# Patient Record
Sex: Female | Born: 1935 | Race: White | Hispanic: No | Marital: Married | Smoking: Never smoker
Health system: Southern US, Community
[De-identification: ages and names within clinical notes are randomized; demographics above are authoritative.]

## PROBLEM LIST (undated history)

## (undated) DIAGNOSIS — Z889 Allergy status to unspecified drugs, medicaments and biological substances status: Secondary | ICD-10-CM

## (undated) DIAGNOSIS — Z9049 Acquired absence of other specified parts of digestive tract: Secondary | ICD-10-CM

## (undated) DIAGNOSIS — N765 Ulceration of vagina: Secondary | ICD-10-CM

## (undated) DIAGNOSIS — N816 Rectocele: Secondary | ICD-10-CM

## (undated) DIAGNOSIS — I119 Hypertensive heart disease without heart failure: Secondary | ICD-10-CM

## (undated) DIAGNOSIS — K219 Gastro-esophageal reflux disease without esophagitis: Secondary | ICD-10-CM

## (undated) DIAGNOSIS — K2100 Gastro-esophageal reflux disease with esophagitis, without bleeding: Secondary | ICD-10-CM

## (undated) DIAGNOSIS — I499 Cardiac arrhythmia, unspecified: Secondary | ICD-10-CM

## (undated) DIAGNOSIS — R7302 Impaired glucose tolerance (oral): Secondary | ICD-10-CM

## (undated) DIAGNOSIS — Z780 Asymptomatic menopausal state: Secondary | ICD-10-CM

## (undated) DIAGNOSIS — E871 Hypo-osmolality and hyponatremia: Secondary | ICD-10-CM

## (undated) DIAGNOSIS — B019 Varicella without complication: Secondary | ICD-10-CM

## (undated) DIAGNOSIS — N952 Postmenopausal atrophic vaginitis: Secondary | ICD-10-CM

## (undated) DIAGNOSIS — M810 Age-related osteoporosis without current pathological fracture: Secondary | ICD-10-CM

## (undated) DIAGNOSIS — I10 Essential (primary) hypertension: Secondary | ICD-10-CM

## (undated) DIAGNOSIS — K5900 Constipation, unspecified: Secondary | ICD-10-CM

## (undated) DIAGNOSIS — N815 Vaginal enterocele: Secondary | ICD-10-CM

## (undated) DIAGNOSIS — R928 Other abnormal and inconclusive findings on diagnostic imaging of breast: Secondary | ICD-10-CM

## (undated) DIAGNOSIS — K649 Unspecified hemorrhoids: Secondary | ICD-10-CM

## (undated) DIAGNOSIS — B029 Zoster without complications: Secondary | ICD-10-CM

## (undated) DIAGNOSIS — H409 Unspecified glaucoma: Secondary | ICD-10-CM

## (undated) DIAGNOSIS — I447 Left bundle-branch block, unspecified: Secondary | ICD-10-CM

## (undated) DIAGNOSIS — I639 Cerebral infarction, unspecified: Secondary | ICD-10-CM

## (undated) DIAGNOSIS — L900 Lichen sclerosus et atrophicus: Secondary | ICD-10-CM

## (undated) HISTORY — DX: Acquired absence of other specified parts of digestive tract: Z90.49

## (undated) HISTORY — DX: Hypertensive heart disease without heart failure: I11.9

## (undated) HISTORY — DX: Rectocele: N81.6

## (undated) HISTORY — DX: Other abnormal and inconclusive findings on diagnostic imaging of breast: R92.8

## (undated) HISTORY — DX: Vaginal enterocele: N81.5

## (undated) HISTORY — DX: Impaired glucose tolerance (oral): R73.02

## (undated) HISTORY — DX: Gastro-esophageal reflux disease with esophagitis, without bleeding: K21.00

## (undated) HISTORY — DX: Gastro-esophageal reflux disease with esophagitis: K21.0

## (undated) HISTORY — PX: CARDIAC CATHETERIZATION: SHX172

## (undated) HISTORY — PX: ABDOMINAL HYSTERECTOMY: SHX81

## (undated) HISTORY — DX: Unspecified glaucoma: H40.9

## (undated) HISTORY — DX: Age-related osteoporosis without current pathological fracture: M81.0

## (undated) HISTORY — DX: Constipation, unspecified: K59.00

## (undated) HISTORY — DX: Asymptomatic menopausal state: Z78.0

## (undated) HISTORY — PX: CHOLECYSTECTOMY: SHX55

## (undated) HISTORY — DX: Postmenopausal atrophic vaginitis: N95.2

## (undated) HISTORY — PX: OTHER SURGICAL HISTORY: SHX169

## (undated) HISTORY — DX: Ulceration of vagina: N76.5

## (undated) HISTORY — DX: Lichen sclerosus et atrophicus: L90.0

## (undated) HISTORY — PX: TONSILLECTOMY: SUR1361

---

## 2004-11-27 ENCOUNTER — Ambulatory Visit: Payer: Self-pay | Admitting: Internal Medicine

## 2005-12-02 ENCOUNTER — Ambulatory Visit: Payer: Self-pay | Admitting: Internal Medicine

## 2006-12-08 ENCOUNTER — Ambulatory Visit: Payer: Self-pay | Admitting: Internal Medicine

## 2008-03-29 ENCOUNTER — Ambulatory Visit: Payer: Self-pay | Admitting: Internal Medicine

## 2008-09-11 ENCOUNTER — Ambulatory Visit: Payer: Self-pay

## 2009-04-09 ENCOUNTER — Ambulatory Visit: Payer: Self-pay | Admitting: Internal Medicine

## 2010-04-17 ENCOUNTER — Ambulatory Visit: Payer: Self-pay | Admitting: Internal Medicine

## 2011-11-02 ENCOUNTER — Ambulatory Visit: Payer: Self-pay | Admitting: Internal Medicine

## 2012-11-02 ENCOUNTER — Ambulatory Visit: Payer: Self-pay

## 2012-12-28 ENCOUNTER — Ambulatory Visit: Payer: Self-pay

## 2013-06-20 ENCOUNTER — Ambulatory Visit: Payer: Self-pay | Admitting: Nephrology

## 2013-07-03 ENCOUNTER — Ambulatory Visit: Payer: Self-pay | Admitting: Nephrology

## 2013-11-07 ENCOUNTER — Ambulatory Visit: Payer: Self-pay

## 2014-01-29 ENCOUNTER — Inpatient Hospital Stay: Payer: Self-pay

## 2014-01-29 LAB — COMPREHENSIVE METABOLIC PANEL
Albumin: 4 g/dL (ref 3.4–5.0)
Alkaline Phosphatase: 83 U/L
Anion Gap: 7 (ref 7–16)
BUN: 14 mg/dL (ref 7–18)
Bilirubin,Total: 0.5 mg/dL (ref 0.2–1.0)
CALCIUM: 8.5 mg/dL (ref 8.5–10.1)
CHLORIDE: 82 mmol/L — AB (ref 98–107)
Co2: 24 mmol/L (ref 21–32)
Creatinine: 1.02 mg/dL (ref 0.60–1.30)
EGFR (African American): 60 — ABNORMAL LOW
EGFR (Non-African Amer.): 52 — ABNORMAL LOW
Glucose: 106 mg/dL — ABNORMAL HIGH (ref 65–99)
Osmolality: 230 (ref 275–301)
POTASSIUM: 4.7 mmol/L (ref 3.5–5.1)
SGOT(AST): 25 U/L (ref 15–37)
SGPT (ALT): 18 U/L (ref 12–78)
Sodium: 113 mmol/L — CL (ref 136–145)
TOTAL PROTEIN: 7.3 g/dL (ref 6.4–8.2)

## 2014-01-29 LAB — URINALYSIS, COMPLETE
BACTERIA: NONE SEEN
BLOOD: NEGATIVE
Bilirubin,UR: NEGATIVE
GLUCOSE, UR: NEGATIVE mg/dL (ref 0–75)
Ketone: NEGATIVE
Leukocyte Esterase: NEGATIVE
Nitrite: NEGATIVE
PROTEIN: NEGATIVE
Ph: 6 (ref 4.5–8.0)
SPECIFIC GRAVITY: 1.011 (ref 1.003–1.030)
Squamous Epithelial: <1
WBC UR: <1 /HPF (ref 0–5)

## 2014-01-29 LAB — PROTIME-INR
INR: 1
PROTHROMBIN TIME: 13.1 s (ref 11.5–14.7)

## 2014-01-29 LAB — CBC
HCT: 37.6 % (ref 35.0–47.0)
HGB: 13.4 g/dL (ref 12.0–16.0)
MCH: 32.2 pg (ref 26.0–34.0)
MCHC: 35.7 g/dL (ref 32.0–36.0)
MCV: 90 fL (ref 80–100)
Platelet: 250 10*3/uL (ref 150–440)
RBC: 4.16 10*6/uL (ref 3.80–5.20)
RDW: 13.2 % (ref 11.5–14.5)
WBC: 8.9 10*3/uL (ref 3.6–11.0)

## 2014-01-29 LAB — SODIUM
SODIUM: 117 mmol/L — AB (ref 136–145)
Sodium: 116 mmol/L — CL (ref 136–145)

## 2014-01-29 LAB — CK TOTAL AND CKMB (NOT AT ARMC)
CK, Total: 104 U/L
CK-MB: 2.9 ng/mL (ref 0.5–3.6)

## 2014-01-29 LAB — TROPONIN I: Troponin-I: <0.02 ng/mL

## 2014-01-30 LAB — SODIUM
SODIUM: 117 mmol/L — AB (ref 136–145)
SODIUM: 119 mmol/L — AB (ref 136–145)
Sodium: 118 mmol/L — CL (ref 136–145)
Sodium: 118 mmol/L — CL (ref 136–145)
Sodium: 116 mmol/L — CL (ref 136–145)
Sodium: 121 mmol/L — ABNORMAL LOW (ref 136–145)

## 2014-01-30 LAB — TSH: Thyroid Stimulating Horm: 3.19 u[IU]/mL

## 2014-01-31 LAB — BASIC METABOLIC PANEL
Anion Gap: 8 (ref 7–16)
BUN: 13 mg/dL (ref 7–18)
CALCIUM: 8.2 mg/dL — AB (ref 8.5–10.1)
CO2: 24 mmol/L (ref 21–32)
Chloride: 92 mmol/L — ABNORMAL LOW (ref 98–107)
Creatinine: 0.91 mg/dL (ref 0.60–1.30)
EGFR (African American): >60
EGFR (Non-African Amer.): 59 — ABNORMAL LOW
GLUCOSE: 105 mg/dL — AB (ref 65–99)
OSMOLALITY: 250 (ref 275–301)
POTASSIUM: 4.5 mmol/L (ref 3.5–5.1)
Sodium: 124 mmol/L — ABNORMAL LOW (ref 136–145)

## 2014-01-31 LAB — URINE CULTURE

## 2014-02-01 LAB — SODIUM: Sodium: 124 mmol/L — ABNORMAL LOW (ref 136–145)

## 2014-02-01 LAB — BASIC METABOLIC PANEL
Anion Gap: 6 — ABNORMAL LOW (ref 7–16)
BUN: 12 mg/dL (ref 7–18)
CHLORIDE: 92 mmol/L — AB (ref 98–107)
CREATININE: 0.97 mg/dL (ref 0.60–1.30)
Calcium, Total: 8.4 mg/dL — ABNORMAL LOW (ref 8.5–10.1)
Co2: 27 mmol/L (ref 21–32)
EGFR (African American): >60
GFR CALC NON AF AMER: 55 — AB
GLUCOSE: 105 mg/dL — AB (ref 65–99)
Osmolality: 252 (ref 275–301)
POTASSIUM: 4.4 mmol/L (ref 3.5–5.1)
SODIUM: 125 mmol/L — AB (ref 136–145)

## 2014-02-02 LAB — BASIC METABOLIC PANEL
Anion Gap: 9 (ref 7–16)
BUN: 20 mg/dL — ABNORMAL HIGH (ref 7–18)
CO2: 25 mmol/L (ref 21–32)
CREATININE: 1.11 mg/dL (ref 0.60–1.30)
Calcium, Total: 8.9 mg/dL (ref 8.5–10.1)
Chloride: 92 mmol/L — ABNORMAL LOW (ref 98–107)
EGFR (African American): 54 — ABNORMAL LOW
GFR CALC NON AF AMER: 47 — AB
GLUCOSE: 108 mg/dL — AB (ref 65–99)
Osmolality: 257 (ref 275–301)
Potassium: 4.2 mmol/L (ref 3.5–5.1)
Sodium: 126 mmol/L — ABNORMAL LOW (ref 136–145)

## 2014-03-21 DIAGNOSIS — N816 Rectocele: Secondary | ICD-10-CM

## 2014-03-21 HISTORY — DX: Rectocele: N81.6

## 2014-11-08 ENCOUNTER — Ambulatory Visit: Payer: Self-pay | Admitting: Family Medicine

## 2015-03-16 NOTE — H&P (Signed)
PATIENT NAME:  Angelica Mcclain, Angelica Mcclain MR#:  [id number] DATE OF BIRTH:  [birth year]  DATE OF ADMISSION:  01/29/2014  ADMITTING PHYSICIAN: Gladstone Lighter, M.D.   PRIMARY CARE PHYSICIAN: Dr. Ola Spurr.   PRIMARY NEPHROLOGIST: Dr. Anthonette Legato.   CHIEF COMPLAINT: Syncope.   HISTORY OF PRESENT ILLNESS: Angelica Mcclain is an 81 year old, very pleasant Caucasian female with past medical history significant for hypertension, gastroesophageal reflux disease and recent incidental diagnosis of hyponatremia about 9 months ago when routine labs were done by PCP. The patient was referred to see Dr. Anthonette Legato, nephrologist, and her free water intake has been reduced recently. Her labs about 2 weeks ago done by nephrology were normal, according to the patient. She started feeling weak over the last couple of days. This morning, when she woke up, she felt very weak and went to the bathroom and passed out. She denied any seizure-like aura prior to passing out and it was an unwitnessed fall. After a few minutes, the patient woke up by herself and decided to take a shower, as she was feeling alright. A family member called at the time, and the patient was telling them that she had a fall, and so her family was alerted and she was brought to the ER. At this time, the patient is awake, alert, oriented and denies any complaints. Her sodium was noted to be at 113.   PAST MEDICAL HISTORY: 1.  Hypertension.  2.  Gastroesophageal reflux disease.  3.  Chronic left bundle branch block.  PAST SURGICAL HISTORY: 1.  Left hip surgery.  2.  Hysterectomy.  3.  Bladder tack surgery.  4.  Tonsillectomy. 5.  Cholecystectomy.   ALLERGIES TO MEDICATIONS: PENICILLIN.   CURRENT HOME MEDICATIONS: 1.  Premarin vaginal cream 4.431 mg 1 applicator every other day.  2.  Benazepril 40 mg p.o. daily.  3.  Aldactone 25 mg p.o. daily.  4.  Clobetasol 0.05% ointment as needed for itching, rash.  5.  Verapamil 300 mg p.o. at bedtime.   6.  Hydroxyzine 25 mg 1 to 2 tablets at bedtime.  7.  Omeprazole 20 mg p.o. daily.   SOCIAL HISTORY: Lives at home with her husband. No smoking or alcohol use.   FAMILY HISTORY: Mom with heart disease. Significant history of cancer in the family. Two sisters died from cancer; one of them had lung cancer and the other with renal cell carcinoma and 1 other brother also had lung cancer.   REVIEW OF SYSTEMS:  CONSTITUTIONAL: No fever. Positive for fatigue and weakness.  EYES: No blurred vision, double vision, inflammation, glaucoma or cataracts. Uses reading glasses.  EARS, NOSE, THROAT: No tinnitus, ear pain, hearing loss, epistaxis or discharge.   RESPIRATORY: No cough, wheeze, hemoptysis or COPD.  CARDIOVASCULAR: No chest pain, orthopnea, edema, arrhythmia, palpitations or syncope.  GASTROINTESTINAL: No nausea, vomiting, diarrhea, abdominal pain, hematemesis or melena.  GENITOURINARY: No dysuria. Decreased frequency of urination noted. No renal calculus or incontinence.  ENDOCRINE: No polyuria, nocturia, thyroid problems, heat or cold intolerance.  HEMATOLOGY: No anemia or easy bruising. Mild bleeding from the skin tears that she developed  after the fall.   MUSCULOSKELETAL: No neck, back or shoulder pain, arthritis or gout.  SKIN: No acne, rash or lesions.  NEUROLOGIC: No numbness, weakness, CVA, TIA or seizures.  PSYCHOLOGIC: No anxiety, insomnia or depression.   PHYSICAL EXAMINATION: VITAL SIGNS: Temperature 97.9 degrees Fahrenheit, pulse 74, respirations 18, blood pressure 168/73, pulse ox 97% on room air.  GENERAL: Well-built, well-nourished  female lying in bed, not in any acute distress.  HEENT: Normocephalic, atraumatic. Pupils equal, round, reacting to light. Anicteric sclerae. Extraocular movements intact. Oropharynx clear without erythema, mass or exudates.  NECK: Supple. No thyromegaly, JVD or carotid bruits. No lymphadenopathy.  LUNGS: Clear to auscultation bilaterally. No  wheeze or crackles. No use of accessory muscles for breathing.  CARDIOVASCULAR: S1, S2, regular rate and rhythm. A 3/6 systolic murmur present. No rubs or gallops.  ABDOMEN: Soft, nontender, nondistended. No hepatosplenomegaly. Normal bowel sounds.  EXTREMITIES: No pedal edema. No clubbing or cyanosis, 2+ dorsalis pedis pulses palpable bilaterally.  SKIN: No acne, rash or lesions other than small abrasions noted on the right side of the forehead and also the right leg from the new fall. Bleeding has stopped currently.  NEUROLOGICAL:  Cranial nerves II through XII remain intact. No gross motor or sensory deficits.  PSYCHOLOGICAL: The patient is awake, alert, oriented x 3.   LABORATORY DATA: WBC is 8.9, hemoglobin 13.4, hematocrit 37.3, platelet count 250.   Sodium 113, potassium 4.7, chloride 82, bicarb 24, BUN 14, creatinine 1.02, glucose 106 and calcium of 8.5.   ALT 18, AST 25, alk phos 83, total bili 0.5 and albumin of 4.0. INR is 1.0. CK 104, CK-MB 2.9, troponin less than 0.02.  Chest x-ray showing clear lung fields, minimal right basilar atelectasis is present. CT of the head without contrast showing no evidence of acute intracranial hemorrhage. Old lacunar infarctions in the left basal ganglia are present, which are stable, and there is evidence of chronic small vessel ischemic disease. No evidence of any acute skull fracture noted.   EKG showing left bundle branch block which is chronic, according to the patient. Heart rate of 71.   ASSESSMENT AND PLAN: An 81 year old female with history of hypertension and left bundle branch block presenting with hyponatremia with sodium of 113 and syncopal episode.  1.  Acute on chronic hyponatremia, likely cause of weakness and fall. Could be hypovolemia on top of syndrome of inappropriate antidiuretic hormone secretion is likely. Continue IV normal saline at this time. Recheck sodium this evening, and we will get consult.  2.  Hypertension. Continue  Benicar, verapamil and Aldactone.  3.  Gastroesophageal reflux disease.  The patient is on Protonix.   CODE STATUS: FULL CODE.   TIME SPENT ON ADMISSION: 50 minutes.   EYES: [place] lipids 1000 office chronic left bundle branch block and has    ____________________________ Gladstone Lighter, MD rk:dmm D: 01/29/2014 12:16:18 ET T: 01/29/2014 13:22:44 ET JOB#: [id number]  cc: Gladstone Lighter, MD, <Dictator> [place] Lilian Kapur, MD Cheral Marker. Ola Spurr, MD Gladstone Lighter MD ELECTRONICALLY SIGNED 01/29/2014 15:12

## 2015-03-16 NOTE — Discharge Summary (Signed)
PATIENT NAME:  Angelica Mcclain, Angelica Mcclain MR#:  [id number] DATE OF BIRTH:  05/17/1930  DATE OF ADMISSION:  01/29/2014 DATE OF DISCHARGE:  02/02/2014  DISCHARGE DIAGNOSES: 1.  Hyponatremia.  2.  Hypertension.  3.  Cough.   CONSULTING PHYSICIAN:  Dr. Candiss Norse in nephrology.   HISTORY OF PRESENT ILLNESS: Please see initial history and physical. Briefly, the patient was admitted after a fall. She was found to have a sodium of 113.   HOSPITAL COURSE BY ISSUE:  1.  Hyponatremia. The patient was seen by renal. Her sodium was managed with IV fluids and normal saline, as well as fluid restriction. On day of discharge, sodium was up to 126. We did advise her to limit her free water intake.  2.  Cough. The patient had a chest x-ray that did show some mild abnormalities, but CT of her chest was negative for any malignancy or pneumonia.  3.  Hypertension. The patient remained on her olmesartan and verapamil. We did hold spironolactone. Blood pressure was a little elevated in that hospital but, she was discharged on those medications.  4.  Gastroesophageal reflux disease. She continued on her PPI.  5.  Urinary retention. The patient had an issue at first where she had difficulty urinating and had 1000 mL. She had a Foley for 1 day. Urinalysis was negative. She was able to void for several days after the catheter was removed.   DISCHARGE MEDICATIONS: Please see [facility] discharge summary. The patient will stop her spironolactone.  DISCHARGE DIET: Regular diet with regular consistency. We advised her to free fluid restrict but not to restrict her sodium at this point.   DISCHARGE FOLLOWUP:  The patient will follow up with Dr. Ola Mcclain in 1 to 2 weeks. She will need a B-met at that day. She will also follow up with renal.   This discharge took 40 minutes.   ____________________________ Angelica Mcclain. Angelica Spurr, MD dpf:dmm D: 02/05/2014 10:40:52 ET T: 02/05/2014 19:41:38 ET JOB#: [id number]  cc: Angelica Mcclain. Angelica Spurr, MD,  <Dictator> Angelica Angelica Spurr MD ELECTRONICALLY SIGNED 02/05/2014 21:08

## 2015-04-09 ENCOUNTER — Other Ambulatory Visit: Payer: Self-pay | Admitting: Nurse Practitioner

## 2015-04-09 DIAGNOSIS — K219 Gastro-esophageal reflux disease without esophagitis: Secondary | ICD-10-CM

## 2015-04-16 ENCOUNTER — Ambulatory Visit: Payer: Self-pay

## 2015-04-23 ENCOUNTER — Ambulatory Visit: Payer: Self-pay

## 2015-04-30 ENCOUNTER — Encounter: Payer: Self-pay | Admitting: *Deleted

## 2015-05-02 ENCOUNTER — Emergency Department: Payer: PPO

## 2015-05-02 ENCOUNTER — Ambulatory Visit: Admission: RE | Admit: 2015-05-02 | Payer: PPO | Source: Ambulatory Visit | Admitting: Unknown Physician Specialty

## 2015-05-02 ENCOUNTER — Encounter
Admission: EM | Disposition: A | Discharge status: Other Acute Inpatient Hospital | Payer: Self-pay | Source: Home / Self Care | Attending: Emergency Medicine

## 2015-05-02 ENCOUNTER — Inpatient Hospital Stay (HOSPITAL_COMMUNITY): Payer: PPO

## 2015-05-02 ENCOUNTER — Inpatient Hospital Stay (HOSPITAL_COMMUNITY)
Admission: EM | Admit: 2015-05-02 | Discharge: 2015-05-24 | DRG: 311 | Disposition: E | Payer: PPO | Source: Other Acute Inpatient Hospital | Attending: Pulmonary Disease | Admitting: Pulmonary Disease

## 2015-05-02 ENCOUNTER — Emergency Department
Admission: EM | Admit: 2015-05-02 | Discharge: 2015-05-02 | Disposition: A | Discharge status: Other Acute Inpatient Hospital | Payer: PPO | Attending: Emergency Medicine | Admitting: Emergency Medicine

## 2015-05-02 ENCOUNTER — Encounter: Payer: Self-pay | Admitting: Emergency Medicine

## 2015-05-02 DIAGNOSIS — Z885 Allergy status to narcotic agent status: Secondary | ICD-10-CM

## 2015-05-02 DIAGNOSIS — Z7982 Long term (current) use of aspirin: Secondary | ICD-10-CM | POA: Insufficient documentation

## 2015-05-02 DIAGNOSIS — N189 Chronic kidney disease, unspecified: Secondary | ICD-10-CM | POA: Diagnosis present

## 2015-05-02 DIAGNOSIS — I249 Acute ischemic heart disease, unspecified: Secondary | ICD-10-CM | POA: Diagnosis present

## 2015-05-02 DIAGNOSIS — I440 Atrioventricular block, first degree: Secondary | ICD-10-CM | POA: Diagnosis not present

## 2015-05-02 DIAGNOSIS — I469 Cardiac arrest, cause unspecified: Secondary | ICD-10-CM | POA: Diagnosis not present

## 2015-05-02 DIAGNOSIS — R40243 Glasgow coma scale score 3-8: Secondary | ICD-10-CM | POA: Diagnosis not present

## 2015-05-02 DIAGNOSIS — M810 Age-related osteoporosis without current pathological fracture: Secondary | ICD-10-CM | POA: Insufficient documentation

## 2015-05-02 DIAGNOSIS — J9601 Acute respiratory failure with hypoxia: Secondary | ICD-10-CM | POA: Diagnosis present

## 2015-05-02 DIAGNOSIS — I462 Cardiac arrest due to underlying cardiac condition: Secondary | ICD-10-CM | POA: Diagnosis present

## 2015-05-02 DIAGNOSIS — J9600 Acute respiratory failure, unspecified whether with hypoxia or hypercapnia: Secondary | ICD-10-CM

## 2015-05-02 DIAGNOSIS — Z8673 Personal history of transient ischemic attack (TIA), and cerebral infarction without residual deficits: Secondary | ICD-10-CM | POA: Diagnosis not present

## 2015-05-02 DIAGNOSIS — Z66 Do not resuscitate: Secondary | ICD-10-CM | POA: Diagnosis present

## 2015-05-02 DIAGNOSIS — N179 Acute kidney failure, unspecified: Secondary | ICD-10-CM | POA: Diagnosis present

## 2015-05-02 DIAGNOSIS — I447 Left bundle-branch block, unspecified: Secondary | ICD-10-CM | POA: Diagnosis present

## 2015-05-02 DIAGNOSIS — I2510 Atherosclerotic heart disease of native coronary artery without angina pectoris: Secondary | ICD-10-CM

## 2015-05-02 DIAGNOSIS — E785 Hyperlipidemia, unspecified: Secondary | ICD-10-CM | POA: Diagnosis present

## 2015-05-02 DIAGNOSIS — I2109 ST elevation (STEMI) myocardial infarction involving other coronary artery of anterior wall: Secondary | ICD-10-CM | POA: Diagnosis not present

## 2015-05-02 DIAGNOSIS — E872 Acidosis, unspecified: Secondary | ICD-10-CM

## 2015-05-02 DIAGNOSIS — I4901 Ventricular fibrillation: Secondary | ICD-10-CM

## 2015-05-02 DIAGNOSIS — R579 Shock, unspecified: Secondary | ICD-10-CM | POA: Diagnosis not present

## 2015-05-02 DIAGNOSIS — R4020 Unspecified coma: Secondary | ICD-10-CM

## 2015-05-02 DIAGNOSIS — Q219 Congenital malformation of cardiac septum, unspecified: Secondary | ICD-10-CM | POA: Diagnosis not present

## 2015-05-02 DIAGNOSIS — I1310 Hypertensive heart and chronic kidney disease without heart failure, with stage 1 through stage 4 chronic kidney disease, or unspecified chronic kidney disease: Secondary | ICD-10-CM | POA: Diagnosis present

## 2015-05-02 DIAGNOSIS — I6782 Cerebral ischemia: Secondary | ICD-10-CM | POA: Insufficient documentation

## 2015-05-02 DIAGNOSIS — Z90710 Acquired absence of both cervix and uterus: Secondary | ICD-10-CM

## 2015-05-02 DIAGNOSIS — H409 Unspecified glaucoma: Secondary | ICD-10-CM | POA: Insufficient documentation

## 2015-05-02 DIAGNOSIS — Z880 Allergy status to penicillin: Secondary | ICD-10-CM | POA: Diagnosis not present

## 2015-05-02 DIAGNOSIS — Z515 Encounter for palliative care: Secondary | ICD-10-CM | POA: Diagnosis not present

## 2015-05-02 DIAGNOSIS — K219 Gastro-esophageal reflux disease without esophagitis: Secondary | ICD-10-CM | POA: Insufficient documentation

## 2015-05-02 DIAGNOSIS — Z955 Presence of coronary angioplasty implant and graft: Secondary | ICD-10-CM | POA: Diagnosis not present

## 2015-05-02 DIAGNOSIS — E876 Hypokalemia: Secondary | ICD-10-CM | POA: Diagnosis present

## 2015-05-02 DIAGNOSIS — Z79899 Other long term (current) drug therapy: Secondary | ICD-10-CM | POA: Insufficient documentation

## 2015-05-02 DIAGNOSIS — R570 Cardiogenic shock: Secondary | ICD-10-CM

## 2015-05-02 HISTORY — DX: Varicella without complication: B01.9

## 2015-05-02 HISTORY — DX: Cardiac arrhythmia, unspecified: I49.9

## 2015-05-02 HISTORY — DX: Allergy status to unspecified drugs, medicaments and biological substances: Z88.9

## 2015-05-02 HISTORY — DX: Cerebral infarction, unspecified: I63.9

## 2015-05-02 HISTORY — DX: Hypo-osmolality and hyponatremia: E87.1

## 2015-05-02 HISTORY — PX: CARDIAC CATHETERIZATION: SHX172

## 2015-05-02 HISTORY — DX: Unspecified hemorrhoids: K64.9

## 2015-05-02 HISTORY — DX: Gastro-esophageal reflux disease without esophagitis: K21.9

## 2015-05-02 HISTORY — DX: Left bundle-branch block, unspecified: I44.7

## 2015-05-02 HISTORY — DX: Essential (primary) hypertension: I10

## 2015-05-02 HISTORY — DX: Zoster without complications: B02.9

## 2015-05-02 LAB — BLOOD GAS, ARTERIAL
Acid-base deficit: 21.9 mmol/L — ABNORMAL HIGH (ref 0.0–2.0)
Acid-base deficit: 12 mmol/L — ABNORMAL HIGH (ref 0.0–2.0)
Allens test (pass/fail): POSITIVE — AB
Allens test (pass/fail): POSITIVE — AB
BICARBONATE: 8.3 meq/L — AB (ref 21.0–28.0)
BICARBONATE: 14 meq/L — AB (ref 21.0–28.0)
FIO2: 1 %
FIO2: 0.5 %
LHR: 24 {breaths}/min
MECHVT: 500 mL
O2 SAT: 96.8 %
O2 Saturation: 99.9 %
PATIENT TEMPERATURE: 37
PCO2 ART: 33 mmHg (ref 32.0–48.0)
PEEP/CPAP: 5 cmH2O
PEEP: 5 cmH2O
PH ART: 7.01 — AB (ref 7.350–7.450)
Patient temperature: 37
RATE: 24 resp/min
VT: 500 mL
pCO2 arterial: 32 mmHg (ref 32.0–48.0)
pH, Arterial: 7.25 — ABNORMAL LOW (ref 7.350–7.450)
pO2, Arterial: 335 mmHg — ABNORMAL HIGH (ref 83.0–108.0)
pO2, Arterial: 102 mmHg (ref 83.0–108.0)

## 2015-05-02 LAB — COMPREHENSIVE METABOLIC PANEL
ALK PHOS: 50 U/L (ref 38–126)
ALT: 74 U/L — ABNORMAL HIGH (ref 14–54)
ALT: 54 U/L (ref 14–54)
ANION GAP: 18 — AB (ref 5–15)
AST: 77 U/L — ABNORMAL HIGH (ref 15–41)
AST: 84 U/L — ABNORMAL HIGH (ref 15–41)
Albumin: 3 g/dL — ABNORMAL LOW (ref 3.5–5.0)
Albumin: 1.8 g/dL — ABNORMAL LOW (ref 3.5–5.0)
Alkaline Phosphatase: 72 U/L (ref 38–126)
Anion gap: 13 (ref 5–15)
BUN: 19 mg/dL (ref 6–20)
BUN: 22 mg/dL — AB (ref 6–20)
CO2: 13 mmol/L — AB (ref 22–32)
CO2: 12 mmol/L — AB (ref 22–32)
CREATININE: 1.55 mg/dL — AB (ref 0.44–1.00)
Calcium: 8 mg/dL — ABNORMAL LOW (ref 8.9–10.3)
Calcium: 6.3 mg/dL — CL (ref 8.9–10.3)
Chloride: 108 mmol/L (ref 101–111)
Chloride: 111 mmol/L (ref 101–111)
Creatinine, Ser: 1.85 mg/dL — ABNORMAL HIGH (ref 0.44–1.00)
GFR calc Af Amer: 35 mL/min — ABNORMAL LOW (ref >60)
GFR calc non Af Amer: 30 mL/min — ABNORMAL LOW (ref >60)
GFR calc non Af Amer: 24 mL/min — ABNORMAL LOW (ref >60)
GFR, EST AFRICAN AMERICAN: 28 mL/min — AB (ref >60)
GLUCOSE: 452 mg/dL — AB (ref 65–99)
Glucose, Bld: 442 mg/dL — ABNORMAL HIGH (ref 65–99)
POTASSIUM: 3.3 mmol/L — AB (ref 3.5–5.1)
Potassium: 3.1 mmol/L — ABNORMAL LOW (ref 3.5–5.1)
Sodium: 139 mmol/L (ref 135–145)
Sodium: 136 mmol/L (ref 135–145)
TOTAL PROTEIN: 3.2 g/dL — AB (ref 6.5–8.1)
Total Bilirubin: 0.4 mg/dL (ref 0.3–1.2)
Total Bilirubin: 0.6 mg/dL (ref 0.3–1.2)
Total Protein: 5.6 g/dL — ABNORMAL LOW (ref 6.5–8.1)

## 2015-05-02 LAB — CBC WITH DIFFERENTIAL/PLATELET
Basophils Absolute: 0.1 10*3/uL (ref 0–0.1)
Basophils Relative: 1 %
Eosinophils Absolute: 0.3 10*3/uL (ref 0–0.7)
Eosinophils Relative: 2 %
HEMATOCRIT: 37.9 % (ref 35.0–47.0)
HEMOGLOBIN: 11.9 g/dL — AB (ref 12.0–16.0)
LYMPHS ABS: 5.7 10*3/uL — AB (ref 1.0–3.6)
Lymphocytes Relative: 35 %
MCH: 30.8 pg (ref 26.0–34.0)
MCHC: 31.3 g/dL — ABNORMAL LOW (ref 32.0–36.0)
MCV: 98.5 fL (ref 80.0–100.0)
Monocytes Absolute: 0.6 10*3/uL (ref 0.2–0.9)
Monocytes Relative: 4 %
NEUTROS ABS: 9.6 10*3/uL — AB (ref 1.4–6.5)
NEUTROS PCT: 58 %
Platelets: 117 10*3/uL — ABNORMAL LOW (ref 150–440)
RBC: 3.85 MIL/uL (ref 3.80–5.20)
RDW: 14.3 % (ref 11.5–14.5)
WBC: 16.3 10*3/uL — AB (ref 3.6–11.0)

## 2015-05-02 LAB — URINALYSIS COMPLETE WITH MICROSCOPIC (ARMC ONLY)
BILIRUBIN URINE: NEGATIVE
Bacteria, UA: NONE SEEN
Glucose, UA: NEGATIVE mg/dL
Hgb urine dipstick: NEGATIVE
KETONES UR: NEGATIVE mg/dL
LEUKOCYTES UA: NEGATIVE
Nitrite: NEGATIVE
PH: 5 (ref 5.0–8.0)
PROTEIN: NEGATIVE mg/dL
Specific Gravity, Urine: 1.016 (ref 1.005–1.030)
Squamous Epithelial / LPF: NONE SEEN

## 2015-05-02 LAB — URINALYSIS, ROUTINE W REFLEX MICROSCOPIC
BILIRUBIN URINE: NEGATIVE
Glucose, UA: 250 mg/dL — AB
Ketones, ur: NEGATIVE mg/dL
Leukocytes, UA: NEGATIVE
Nitrite: NEGATIVE
PH: 6 (ref 5.0–8.0)
Protein, ur: 100 mg/dL — AB
Specific Gravity, Urine: 1.01 (ref 1.005–1.030)
Urobilinogen, UA: 0.2 mg/dL (ref 0.0–1.0)

## 2015-05-02 LAB — URINE MICROSCOPIC-ADD ON

## 2015-05-02 LAB — CBC
HCT: 22.9 % — ABNORMAL LOW (ref 36.0–46.0)
Hemoglobin: 7.5 g/dL — ABNORMAL LOW (ref 12.0–15.0)
MCH: 31 pg (ref 26.0–34.0)
MCHC: 32.8 g/dL (ref 30.0–36.0)
MCV: 94.6 fL (ref 78.0–100.0)
Platelets: 174 10*3/uL (ref 150–400)
RBC: 2.42 MIL/uL — ABNORMAL LOW (ref 3.87–5.11)
RDW: 13.8 % (ref 11.5–15.5)
WBC: 20.5 10*3/uL — ABNORMAL HIGH (ref 4.0–10.5)

## 2015-05-02 LAB — MRSA PCR SCREENING: MRSA BY PCR: NEGATIVE

## 2015-05-02 LAB — PROTIME-INR
INR: 1.69
Prothrombin Time: 20.1 seconds — ABNORMAL HIGH (ref 11.4–15.0)

## 2015-05-02 LAB — TROPONIN I
Troponin I: 0.22 ng/mL — ABNORMAL HIGH (ref <0.031)
Troponin I: 2.63 ng/mL (ref <0.031)

## 2015-05-02 LAB — CORTISOL: CORTISOL PLASMA: 24.8 ug/dL

## 2015-05-02 LAB — LACTIC ACID, PLASMA: LACTIC ACID, VENOUS: 9.6 mmol/L — AB (ref 0.5–2.0)

## 2015-05-02 LAB — PHOSPHORUS: Phosphorus: 3.9 mg/dL (ref 2.5–4.6)

## 2015-05-02 LAB — MAGNESIUM: MAGNESIUM: 1.7 mg/dL (ref 1.7–2.4)

## 2015-05-02 SURGERY — EGD (ESOPHAGOGASTRODUODENOSCOPY)
Anesthesia: General

## 2015-05-02 SURGERY — LEFT HEART CATH AND CORONARY ANGIOGRAPHY
Anesthesia: Moderate Sedation

## 2015-05-02 MED ORDER — MIDAZOLAM HCL 2 MG/2ML IJ SOLN
INTRAMUSCULAR | Status: AC
  Filled 2015-05-02: qty 4

## 2015-05-02 MED ORDER — TIROFIBAN HCL IV 5 MG/100ML: INTRAVENOUS | Status: DC | PRN

## 2015-05-02 MED ORDER — TICAGRELOR 90 MG PO TABS
ORAL_TABLET | ORAL | Status: DC | PRN
  Administered 2015-05-02: 180 mg via ORAL

## 2015-05-02 MED ORDER — TICAGRELOR 90 MG PO TABS: 90.0000 mg | ORAL_TABLET | Freq: Two times a day (BID) | ORAL | Status: DC

## 2015-05-02 MED ORDER — PANTOPRAZOLE SODIUM 40 MG IV SOLR: 40.0000 mg | Freq: Every day | INTRAVENOUS | Status: DC

## 2015-05-02 MED ORDER — NOREPINEPHRINE 4 MG/250ML-% IV SOLN
INTRAVENOUS | Status: AC
  Administered 2015-05-02: 4 mg
  Filled 2015-05-02: qty 250

## 2015-05-02 MED ORDER — BIVALIRUDIN 250 MG IV SOLR
INTRAVENOUS | Status: AC
  Filled 2015-05-02: qty 250

## 2015-05-02 MED ORDER — SODIUM CHLORIDE 0.9 % IV SOLN: 250.0000 mL | INTRAVENOUS | Status: DC | PRN

## 2015-05-02 MED ORDER — FLUMAZENIL 0.5 MG/5ML IV SOLN
INTRAVENOUS | Status: AC
  Filled 2015-05-02: qty 5

## 2015-05-02 MED ORDER — MIDAZOLAM HCL 2 MG/2ML IJ SOLN
INTRAMUSCULAR | Status: DC | PRN
  Administered 2015-05-02: 4 mg via INTRAVENOUS

## 2015-05-02 MED ORDER — AMIODARONE IV BOLUS ONLY 150 MG/100ML
INTRAVENOUS | Status: AC
  Filled 2015-05-02: qty 100

## 2015-05-02 MED ORDER — SODIUM CHLORIDE 0.9 % IV SOLN
1.0000 mg/h | INTRAVENOUS | Status: DC
  Filled 2015-05-02: qty 10

## 2015-05-02 MED ORDER — FENTANYL CITRATE (PF) 100 MCG/2ML IJ SOLN
INTRAMUSCULAR | Status: DC | PRN
  Administered 2015-05-02: 50 ug via INTRAVENOUS

## 2015-05-02 MED ORDER — DEXTROSE 5 % IV SOLN: 60.0000 mg/h | Freq: Once | INTRAVENOUS | Status: DC

## 2015-05-02 MED ORDER — MIDAZOLAM HCL 2 MG/2ML IJ SOLN: 1.0000 mg | INTRAMUSCULAR | Status: DC | PRN

## 2015-05-02 MED ORDER — AMIODARONE HCL IN DEXTROSE 360-4.14 MG/200ML-% IV SOLN
60.0000 mg/h | INTRAVENOUS | Status: DC
  Administered 2015-05-02: 60 mg/h via INTRAVENOUS

## 2015-05-02 MED ORDER — SODIUM CHLORIDE 0.9 % IV SOLN
INTRAVENOUS | Status: AC
  Filled 2015-05-02: qty 2000

## 2015-05-02 MED ORDER — NITROGLYCERIN 5 MG/ML IV SOLN
INTRAVENOUS | Status: AC
  Filled 2015-05-02: qty 10

## 2015-05-02 MED ORDER — SODIUM CHLORIDE 0.9 % IV SOLN
INTRAVENOUS | Status: DC
  Administered 2015-05-02: 14:00:00 via INTRAVENOUS

## 2015-05-02 MED ORDER — SODIUM CHLORIDE 0.9 % IV SOLN: 1000.0000 mL | Freq: Once | INTRAVENOUS | Status: DC

## 2015-05-02 MED ORDER — MIDAZOLAM HCL 5 MG/ML IJ SOLN
1.0000 mg/h | INTRAMUSCULAR | Status: DC
  Filled 2015-05-02: qty 10

## 2015-05-02 MED ORDER — TICAGRELOR 90 MG PO TABS
ORAL_TABLET | ORAL | Status: AC
  Filled 2015-05-02: qty 2

## 2015-05-02 MED ORDER — FENTANYL CITRATE (PF) 100 MCG/2ML IJ SOLN: 100.0000 ug | INTRAMUSCULAR | Status: DC | PRN

## 2015-05-02 MED ORDER — FENTANYL CITRATE (PF) 100 MCG/2ML IJ SOLN: 50.0000 ug | INTRAMUSCULAR | Status: DC | PRN

## 2015-05-02 MED ORDER — NOREPINEPHRINE BITARTRATE 1 MG/ML IV SOLN
INTRAVENOUS | Status: DC | PRN
  Administered 2015-05-02: 10 ug via INTRAVENOUS

## 2015-05-02 MED ORDER — IOHEXOL 300 MG/ML  SOLN
INTRAMUSCULAR | Status: DC | PRN
Start: 2015-05-02 — End: 2015-05-02
  Administered 2015-05-02: 25 mL via INTRAVENOUS
  Administered 2015-05-02: 130 mL via INTRAVENOUS
  Administered 2015-05-02: 30 mL via INTRAVENOUS

## 2015-05-02 MED ORDER — HEPARIN SODIUM (PORCINE) 5000 UNIT/ML IJ SOLN: 5000.0000 [IU] | Freq: Three times a day (TID) | INTRAMUSCULAR | Status: DC

## 2015-05-02 MED ORDER — BIVALIRUDIN BOLUS VIA INFUSION - CUPID
INTRAVENOUS | Status: DC | PRN
  Administered 2015-05-02: 61.2 mg via INTRAVENOUS

## 2015-05-02 MED ORDER — FENTANYL CITRATE (PF) 100 MCG/2ML IJ SOLN
INTRAMUSCULAR | Status: AC
  Filled 2015-05-02: qty 2

## 2015-05-02 MED ORDER — SODIUM BICARBONATE 8.4 % IV SOLN: 50.0000 meq | Freq: Once | INTRAVENOUS | Status: DC

## 2015-05-02 MED ORDER — ASPIRIN 81 MG PO CHEW: 81.0000 mg | CHEWABLE_TABLET | Freq: Every day | ORAL | Status: DC

## 2015-05-02 MED ORDER — MIDAZOLAM HCL 2 MG/2ML IJ SOLN: 4.0000 mg | INTRAMUSCULAR | Status: DC | PRN

## 2015-05-02 MED ORDER — NOREPINEPHRINE BITARTRATE 1 MG/ML IV SOLN
2.0000 ug/min | INTRAVENOUS | Status: DC
  Filled 2015-05-02: qty 4

## 2015-05-02 MED ORDER — FLUMAZENIL 1 MG/10ML IV SOLN
INTRAVENOUS | Status: DC | PRN
  Administered 2015-05-02: 0.2 mg via INTRAVENOUS

## 2015-05-02 MED ORDER — LORAZEPAM 2 MG/ML IJ SOLN: 1.0000 mg | Freq: Once | INTRAMUSCULAR | Status: DC

## 2015-05-02 MED ORDER — SODIUM CHLORIDE 0.9 % IJ SOLN: 3.0000 mL | Freq: Two times a day (BID) | INTRAMUSCULAR | Status: DC

## 2015-05-02 MED ORDER — AMIODARONE HCL IN DEXTROSE 360-4.14 MG/200ML-% IV SOLN: 30.0000 mg/h | INTRAVENOUS | Status: DC

## 2015-05-02 MED ORDER — CHLORHEXIDINE GLUCONATE 0.12 % MT SOLN: 15.0000 mL | Freq: Two times a day (BID) | OROMUCOSAL | Status: DC

## 2015-05-02 MED ORDER — IOPAMIDOL (ISOVUE-370) INJECTION 76%: INTRAVENOUS | Status: DC | PRN

## 2015-05-02 MED ORDER — SODIUM CHLORIDE 0.9 % IJ SOLN: 3.0000 mL | INTRAMUSCULAR | Status: DC | PRN

## 2015-05-02 MED ORDER — AMIODARONE HCL IN DEXTROSE 360-4.14 MG/200ML-% IV SOLN
INTRAVENOUS | Status: AC
Start: 2015-05-02 — End: 2015-05-02
  Administered 2015-05-02: 60 mg/h via INTRAVENOUS
  Filled 2015-05-02: qty 200

## 2015-05-02 MED ORDER — NOREPINEPHRINE BITARTRATE 1 MG/ML IV SOLN: 8.0000 ug/min | Freq: Once | INTRAVENOUS | Status: DC

## 2015-05-02 MED ORDER — CETYLPYRIDINIUM CHLORIDE 0.05 % MT LIQD: 7.0000 mL | Freq: Four times a day (QID) | OROMUCOSAL | Status: DC

## 2015-05-02 MED ORDER — SODIUM CHLORIDE 0.9 % IV SOLN: 250.0000 mg | INTRAVENOUS | Status: DC | PRN

## 2015-05-02 MED ORDER — HEPARIN (PORCINE) IN NACL 2-0.9 UNIT/ML-% IJ SOLN
INTRAMUSCULAR | Status: AC
  Filled 2015-05-02: qty 1000

## 2015-05-02 MED ORDER — VASOPRESSIN 20 UNIT/ML IV SOLN
0.0300 [IU]/min | INTRAVENOUS | Status: DC
  Filled 2015-05-02: qty 2

## 2015-05-02 MED ORDER — NOREPINEPHRINE 4 MG/250ML-% IV SOLN
0.0000 ug/min | INTRAVENOUS | Status: DC
  Filled 2015-05-02: qty 250

## 2015-05-02 MED ORDER — LORAZEPAM 2 MG/ML IJ SOLN
INTRAMUSCULAR | Status: AC
  Filled 2015-05-02: qty 1

## 2015-05-02 SURGICAL SUPPLY — 17 items
BALLN TREK RX 2.5X20 (BALLOONS) ×3
BALLN ~~LOC~~ TREK RX 3.0X20 (BALLOONS) ×3
BALLOON TREK RX 2.5X20 (BALLOONS) ×1 IMPLANT
BALLOON ~~LOC~~ TREK RX 3.0X20 (BALLOONS) ×1 IMPLANT
CATH INFINITI 5FR ANG PIGTAIL (CATHETERS) ×3 IMPLANT
CATH INFINITI 5FR JL4 (CATHETERS) ×3 IMPLANT
CATH INFINITI JR4 5F (CATHETERS) ×3 IMPLANT
CATH VISTA GUIDE 6FR XBLAD4 (CATHETERS) ×3 IMPLANT
DEVICE CLOSURE MYNXGRIP 6/7F (Vascular Products) ×3 IMPLANT
DEVICE INFLAT 30 PLUS (MISCELLANEOUS) ×3 IMPLANT
KIT MANI 3VAL PERCEP (MISCELLANEOUS) ×3 IMPLANT
NEEDLE PERC 18GX7CM (NEEDLE) ×3 IMPLANT
PACK CARDIAC CATH (CUSTOM PROCEDURE TRAY) ×3 IMPLANT
SHEATH AVANTI 6FR X 11CM (SHEATH) ×3 IMPLANT
STENT XIENCE ALPINE RX 2.75X28 (Permanent Stent) ×3 IMPLANT
WIRE EMERALD 3MM-J .035X150CM (WIRE) ×3 IMPLANT
WIRE RUNTHROUGH .014X300CM (WIRE) ×3 IMPLANT

## 2015-05-03 LAB — URINE CULTURE
COLONY COUNT: NO GROWTH
Culture: NO GROWTH

## 2015-05-03 LAB — LACTIC ACID, PLASMA: Lactic Acid, Venous: 11.9 mmol/L (ref 0.5–2.0)

## 2015-05-06 ENCOUNTER — Encounter: Payer: Self-pay | Admitting: Cardiovascular Disease

## 2015-05-09 ENCOUNTER — Ambulatory Visit: Payer: Self-pay | Admitting: Obstetrics and Gynecology

## 2015-05-13 NOTE — Discharge Summary (Signed)
DISCHARGE SUMMARY    Date of admit: 05-30-2015  1:27 PM Date of discharge: 05/30/15  6:59 PM Length of Stay: 0 days  PCP is BABAOFF, MARC E, MD   PROBLEM LIST Principal Problem: Acute Coronary syndrom with  Cardiac arrest   and   Cardiogenic shock   Acute respiratory failure  Coma    DNAR (do not attempt resuscitation)   Palliative care encounter   Terminal care    SUMMARY Angelica Mcclain was 81 y.o. patient with    has a past medical history of History of cholecystectomy; Glucose intolerance (impaired glucose tolerance); Osteoporosis; Rectocele (03/21/2014); Menopause; Vaginal enterocele; Reflux esophagitis; Glaucoma; Abnormal mammogram; Hypertensive cardiovascular disease; Postmenopausal atrophic vaginitis; Lichen sclerosus; Vaginal ulcer; Constipation; Chickenpox; H/O seasonal allergies; Hypertension; Shingles; Hemorrhoids; GERD (gastroesophageal reflux disease); Stroke; Osteoporosis; Hypertensive cardiovascular disease; Glaucoma; Dysrhythmia; Hyponatremia; LBBB (left bundle branch block); and Glucose intolerance (impaired glucose tolerance).   has past surgical history that includes Tonsillectomy; Cholecystectomy; Abdominal hysterectomy; bladder tack; Cardiac catheterization; Cardiac catheterization (N/A, 05/30/15); and Cardiac catheterization (N/A, 2015-05-30).   Admitted on May 30, 2015 with  PAtient persented as cardiac PEA. CAth in [place] showed ACS and is s.p PCI with stent and on anticogulation.SHe remained comatose post intervention. On arrival from [facility] to [facility] - sent here for Induced hypothermia protocol patient was in circulatory shock that quickly became refractory and impoossible to correct  This was considered non-survivable event.  Conversations by PCCM and cards with family and terminal care measures initiated. Patient kept comfortable and expired May 30, 2015    SIGNED Dr. Kalman Shan, M.D., [facility].C.P Pulmonary and Critical Care Medicine Staff  Physician [facility] [place] [facility] Pager: [phone], If no answer or between  15:00h - 7:00h: call [phone]  05/13/2015 10:23 PM

## 2015-05-24 NOTE — Progress Notes (Signed)
Pt asystole on monitor. Cardiac death confirmed by 2 RNs. Family at bedside. CDS notified. MD notified. Unused Fentanyl and Versed infusions returned to 2nd floor pharmacy.

## 2015-05-24 NOTE — ED Notes (Signed)
Family and dr Mayford Knife at bedside

## 2015-05-24 NOTE — Progress Notes (Signed)
  Patient arrived from [facility] after VF arrest with prolonged down time. Underwent PCI of LAD and transferred here for refractory shock.  On arrival, intubated with SBP on 60-70 range despite full-dose levophed. She was hypothermic with core temp 33-34 degrees Celcius. Evidence of diffuse bleeding at multiple sites with skin sloughing.   She was evaluated by myself and the CCM team. Continued to deteriorate. Vasopressin added but remained hypotensive.  Family meeting held by me and CCM team. We discussed poor prognosis and family wanted comfort measures with withdrawal of aggressive care once all her family was here.   The patient is critically ill with multiple organ systems failure and requires high complexity decision making for assessment and support, frequent evaluation and titration of therapies, application of advanced monitoring technologies and extensive interpretation of multiple databases.   Critical Care Time devoted to patient care services described in this note is 45 Minutes.  Bensimhon, Daniel,MD 4:02 PM

## 2015-05-24 NOTE — Progress Notes (Signed)
   05-31-15 0830  Clinical Encounter Type  Visited With Patient;Family  Visit Type Initial;Spiritual support;ED  Referral From Nurse  Consult/Referral To Chaplain  Spiritual Encounters  Spiritual Needs Prayer;Emotional  Stress Factors  Family Stress Factors Health changes  Patient arrived via ambulance. Family extremely concerned. Provided comfort and presence to family and liaison between medical staff and family. Family pastor arrived. Reassured family with prayer and prayer for medical staff. Advised to page with if needed.  Chap. Delice Bison, ext. [phone]

## 2015-05-24 NOTE — ED Notes (Signed)
Patient transported to CT and back with RN ,Medic and RT

## 2015-05-24 NOTE — ED Notes (Signed)
Pt was intubated by Dr Mayford Knife at 857 am  With 8.0 ETT and taped at 24 at lip.

## 2015-05-24 NOTE — Progress Notes (Signed)
   03-May-2015 1600  Clinical Encounter Type  Visited With Patient and family together  Visit Type Initial;Spiritual support;Social support;Death  Referral From Physician  Spiritual Encounters  Spiritual Needs Grief support  Stress Factors  Family Stress Factors Loss   Chaplain was referred to patient via spiritual care consult. When chaplain arrived, patient had recently passed away. Several family members were are at bedside including the patient's husband. Patient's husband is having a particularly hard time. Patient's husband explained that he has been married to the patient for almost 54 years and today also represented his birthday. Patient's husband has a lot of worry and sadness over living without his wife. Patient's husband explained that the patient went unresponsive and fell earlier this morning. Family continues to grieve at bedside but family seems supportive of each other. Page Merrilyn Puma chaplain if there are any further grief support needs. Cranston Neighbor, Chaplain  4:59 PM

## 2015-05-24 NOTE — ED Notes (Signed)
cpr restarted   Epi times 2 and atropine times 1 given at that time   Rhythm returned  And cpr stopped.

## 2015-05-24 NOTE — ED Notes (Signed)
Pt presents post arrest. Brooke Dare airway in place and IO in place to right lower leg. Pt was found in v-fib by ems   Shocked times 8 eight. Pt placed on  Monitor shows rate of 60's  Pos pulses. Dr Mayford Knife at bedside on arrival

## 2015-05-24 NOTE — ED Notes (Signed)
Brought in via ems from home post cardiac arrest.per ems family heard her fall..found unresponsive  Found in v-fib shocked times 4 per ems .positive pulse return. After 4 epis and 450 carodone

## 2015-05-24 NOTE — ED Notes (Signed)
MD at bedside.dr Kirke Corin in with pt and family

## 2015-05-24 NOTE — H&P (Signed)
History and Physical  Patient ID: Angelica Mcclain MRN: [id number] DOB/AGE: 05/15/1930 81 y.o. Admit date: 05-24-2015  Primary Care Physician: Rozanna Box, MD Primary Cardiologist Mariel Kansky  HPI:  This is an 81 year old female who presented this morning after a witnessed cardiac arrest. She has known history of chronic left bundle branch block with a nuclear stress test done last year showing fixed septal defect with an ejection fraction of 39%. She has known history of hypertension, hyperlipidemia, chronic kidney disease and gastroesophageal reflux disease. She has been having recurrent heartburn thought to be due to reflux. She was scheduled to have an EGD done today. However, around 8:00 in the morning the patient had sudden loss of consciousness witnessed by her husband. He initiated CPR and EMS were called who arrived after about 12 minutes. The patient was noted to be in ventricular fibrillation and was shocked a total of 8 times . She was given epinephrine and ultimately had restoration of spontaneous circulation. She was transferred to the ED where she had a brief PEA . CT scan of the head showed no acute abnormalities. EKG showed left bundle branch block.    Review of systems complete and found to be negative unless listed above  Past Medical History  Diagnosis Date  . History of cholecystectomy   . Glucose intolerance (impaired glucose tolerance)   . Osteoporosis   . Rectocele 03/21/2014    large; minimally symptomatic  . Menopause   . Vaginal enterocele   . Reflux esophagitis   . Glaucoma   . Abnormal mammogram     seen by Dr. Okey Dupre  . Hypertensive cardiovascular disease   . Postmenopausal atrophic vaginitis   . Lichen sclerosus   . Vaginal ulcer   . Constipation   . Chickenpox   . H/O seasonal allergies   . Hypertension   . Shingles   . Hemorrhoids   . GERD (gastroesophageal reflux disease)   . Stroke   . Osteoporosis   . Hypertensive cardiovascular  disease   . Glaucoma   . Dysrhythmia   . Hyponatremia   . LBBB (left bundle branch block)   . Glucose intolerance (impaired glucose tolerance)     History reviewed. No pertinent family history.  History   Social History  . Marital Status: Married    Spouse Name: N/A  . Number of Children: N/A  . Years of Education: N/A   Occupational History  . Not on file.   Social History Main Topics  . Smoking status: Never Smoker   . Smokeless tobacco: Not on file  . Alcohol Use: No  . Drug Use: Not on file  . Sexual Activity: Not on file   Other Topics Concern  . Not on file   Social History Narrative    Past Surgical History  Procedure Laterality Date  . Tonsillectomy    . Cholecystectomy    . Abdominal hysterectomy    . Bladder tack    . Cardiac catheterization       Prescriptions prior to admission  Medication Sig Dispense Refill Last Dose  . acetaminophen (TYLENOL) 500 MG tablet Take 500 mg by mouth every 6 (six) hours as needed for moderate pain.   PRN at PRN  . aspirin EC 81 MG tablet Take 81 mg by mouth daily.   05/01/2015 at Unknown time  . cholecalciferol (VITAMIN D) 1000 UNITS tablet Take 2,000 Units by mouth daily.   05/01/2015 at Unknown time  . clobetasol ointment (TEMOVATE)  0.05 % Apply 1 application topically 2 (two) times daily.   05/01/2015 at Unknown time  . estrogens, conjugated, (PREMARIN) 0.625 MG tablet Take 0.625 mg by mouth daily. Take daily for 21 days then do not take for 7 days.   unknown at unknown  . hydrALAZINE (APRESOLINE) 50 MG tablet Take 50 mg by mouth 2 (two) times daily.   05/01/2015 at Unknown time  . latanoprost (XALATAN) 0.005 % ophthalmic solution Place 1 drop into both eyes at bedtime.    05/01/2015 at Unknown time  . olmesartan (BENICAR) 40 MG tablet Take 40 mg by mouth daily.   05/01/2015 at Unknown time  . omeprazole (PRILOSEC) 20 MG capsule Take 20 mg by mouth daily.   05/01/2015 at Unknown time  . sucralfate (CARAFATE) 1 G tablet Take 1 g by  mouth 4 (four) times daily.   05/01/2015 at Unknown time  . Verapamil HCl CR 300 MG CP24 Take 300 mg by mouth at bedtime.   05/01/2015 at Unknown time    Physical Exam: Blood pressure 81/58, pulse 90, resp. rate 17, height 5\' 4"  (1.626 m), weight 180 lb (81.647 kg), SpO2 95 %.  Constitutional: The patient is intubated and critically ill. There is mild cyanosis. HENT: No nasal discharge.  Head: Bloody secretions are noted from the ET tube. Eyes: Pupils are equal and round. No discharge.  Neck: Normal range of motion. Neck supple. No JVD present. No thyromegaly present.  Cardiovascular: Normal rate, regular rhythm, normal heart sounds. Exam reveals no gallop and no friction rub. No murmur heard.  Pulmonary/Chest: The patient is intubated.  Abdominal: Soft. Bowel sounds are normal. She exhibits no distension. There is no tenderness. There is no rebound and no guarding.  Musculoskeletal: Normal range of motion. She exhibits no edema and no tenderness.  Neurological: The patient seems to have random respirations with episodes of agitation. Skin: Skin is warm and dry. No rash noted. She is not diaphoretic. No erythema. No pallor.  Psychiatric: She is intubated and minimally responsive.    Labs:   Lab Results  Component Value Date   WBC 16.3* 05/07/15   HGB 11.9* May 07, 2015   HCT 37.9 2015/05/07   MCV 98.5 2015/05/07   PLT 117* 05-07-2015    Recent Labs Lab 2015/05/07 0912  NA 139  K 3.1*  CL 108  CO2 13*  BUN 19  CREATININE 1.55*  CALCIUM 8.0*  PROT 5.6*  BILITOT 0.4  ALKPHOS 72  ALT 74*  AST 77*  GLUCOSE 442*   Lab Results  Component Value Date   TROPONINI 0.22* 07-May-2015   No results found for: CHOL No results found for: HDL No results found for: LDLCALC No results found for: TRIG No results found for: CHOLHDL No results found for: LDLDIRECT    Radiology: Dg Chest 1 View  05/07/2015   CLINICAL DATA:  Status post intubation following cardiac arrest  EXAM: CHEST  1  VIEW  COMPARISON:  01/29/2014  FINDINGS: Cardiac shadow is mildly enlarged. An endotracheal tube is seen 14 mm above the carina. Nasogastric catheter is noted extending into the stomach. Aortic calcifications are seen. The lungs are well aerated bilaterally without focal infiltrate or sizable effusion. No acute bony abnormality is noted.  IMPRESSION: Endotracheal tube and nasogastric catheter as described. No acute abnormality is noted.   Electronically Signed   By: Alcide Clever M.D.   On: May 07, 2015 09:48   Ct Head Wo Contrast  May 07, 2015   CLINICAL DATA:  Cardiac  arrest.  Head injury due to collapse.  EXAM: CT HEAD WITHOUT CONTRAST  TECHNIQUE: Contiguous axial images were obtained from the base of the skull through the vertex without intravenous contrast.  COMPARISON:  CT head 01/29/2014  FINDINGS: Chronic infarct in the left internal capsule and centrum semiovale. Negative for acute ischemic infarction  Negative for hemorrhage or mass.  Ventricle size is normal.  No shift of the midline structures.  No evidence of acute anoxic edema.  IMPRESSION: Atrophy and chronic ischemia.  No acute abnormality.   Electronically Signed   By: Marlan Palau M.D.   On: May 07, 2015 10:35    EKG: Left bundle branch block   ASSESSMENT AND PLAN:  1. Cardiac arrest: Due to anterior myocardial infarction with chronic left bundle branch block. Urgent cardiac catheterization was performed via the left femoral artery which showed a 99% mid LAD stenosis. This was treated successfully with angioplasty and drug-eluting stent placement. There was also an 80% mid left circumflex stenosis which was left to be treated medically. Continue dual antiplatelets therapy with aspirin and Brilinta (loading dose was given through the NG tube). Start hypothermia protocol although the patient was actually hypothermic without intervention.  2. Shock: Throughout cardiac catheterization she relatively stable and required only 2 g of  norepinephrine drip. An intra-aortic balloon pump was not placed for that reason.  The patient was started on Aggrastat drip and the arterial sheath was removed with the use of a closure device. The patient was agitated and was giving sedation with Versed and fentanyl. Shortly after, her blood pressure was 50/30. The dose of the Levophed was gradually increased and she was given one fluid bolus. I decided to also give her flumazenil 0.2 mg to reverse some of the effect of sedation. She was noted to have diffuse bleeding from IV sites and through the ET tube. Thus, I decided to discontinue Aggrastat. There was no evidence of bleeding through the left groin site. The etiology of her shock could be still cardiogenic or hemorrhagic. I recommend repeating her labs upon arrival to [facility].  I don't think she is a good candidate for an intra-aortic balloon pump given high risk for recurrent bleeding on heparin. I think we should manage her medically from now with the use of pressors and inotropes if needed. Overall prognosis is poor. I have updated her family members and also spoke with Dr. Lady Gary.  Critical care time: 90 minutes  Signed: Lorine Bears MD, [facility] 07-May-2015, 1:07 PM

## 2015-05-24 NOTE — Care Management Note (Signed)
Case Management Note  Patient Details  Name: Angelica Mcclain MRN: [id number] Date of Birth: 09/05/1930  Subjective/Objective:   Adm w cardiac arrest, vent                 Action/Plan:lives at home, pcp dr Kandyce Rud   Expected Discharge Date:                  Expected Discharge Plan:  Home w Home Health Services  In-House Referral:     Discharge planning Services     Post Acute Care Choice:    Choice offered to:     DME Arranged:    DME Agency:     HH Arranged:    HH Agency:     Status of Service:     Medicare Important Message Given:    Date Medicare IM Given:    Medicare IM give by:    Date Additional Medicare IM Given:    Additional Medicare Important Message give by:     If discussed at Long Length of Stay Meetings, dates discussed:    Additional Comments: ur review done.  Hanley Hays, RN 05-23-15, 1:52 PM

## 2015-05-24 NOTE — H&P (Signed)
PULMONARY / CRITICAL CARE MEDICINE   Name: Angelica Mcclain MRN: [id number] DOB: [birth year]    ADMISSION DATE:  (Not on file) CONSULTATION DATE: 6/8   CHIEF COMPLAINT:  Cardiac arrest  INITIAL PRESENTATION:  81 yo female with witnessed cardiac arrest with bystandard CPR. EMS initiated ACLS after 8 defib. Attempts, epi, and amiodarone and supraglottic airway. Brief PEA arrest requiring ACLS with ROSC (minimally 24 minutes). Cardiac Cath performed showing significant lesions with stent to 99% LAD. Hypothermia was terminated at [facility]. She is now unresponsive, hypotensive on of levophed post 3 L of crystalloid. Sent to [facility] for further eval and management   STUDIES:  6/9: CT head Atrophy and chronic ischemia. No acute abnormality   6/9: Cath: Ost LM to LM lesion, 40% stenosed.  Prox Cx lesion, 40% stenosed.  Mid Cx lesion, 80% stenosed.  Prox LAD to Mid LAD lesion, 99% stenosed.  Mid LAD lesion, 50% stenosed. 1. Significant 2 vessel coronary artery disease with the culprit being subtotal occlusion of the mid LAD with TIMI 2 flow. 80% mid left circumflex stenosis. The RCA was nondominant and was not injected. 2. Moderately to severely reduced LV systolic function with an ejection fraction of 30% and global hypokinesis. 3. Mildly elevated left ventricular end-diastolic pressure. 4. Successful angioplasty and drug-eluting stent placement to the proximal into mid LAD.  SIGNIFICANT EVENTS: 6/9 Witnessed cardiac arrest at home with bystandard CPR. ACLS provided and [facility] airway. ROSC achieved in field.Subsequent PEA in [place] ED. 8.0 tube exchanged in ED. Cardiac cath with stent placement in LAD. Hypothermia discontinued d/t coagulopathy and on going shock. Transferred to [facility].    HISTORY OF PRESENT ILLNESS:  Angelica Mcclain is a 81 yo female admitted to [facility] ED s/p witnessed cardiac arrest. She has a previous PMH of HTN, LBBB with fixed septal defect and ejection fraction  of 39%, CKD, GERD, hyperlipidemia, vaginal enterocele. It is noted to have had recurrent heartburn thought to be d/t reflux which may have been an anginal equivalent. She was planning for endoscopy. At approximately 0800 6/9 she had a sudden loss of consciousness. Husband witnessed the arrest and began CPR. EMS arrived at the scene after approx. 12 minutes and noted the patient was in VFib. She was subsequently defibrillated a 8 times with 4 dose of epi and amiodarone prior to ROSC and intubated with king airway. Upon arrival to [facility] ED she suffered a brief PEA arrest of and was given 2 doses of Epi and 1 atropine and achieved ROSC.Marland Kitchen King airway was exchanged with an 8.0 ETT at 24 at the lip. Head CT was performed and showed no acute abnormalities. EKG showed LBBB. She was then taken to the cath lab found 99percent stenosed LAD which was stented with an Alpine stent with resultant TIMI-3 flow. Mid circumflex was 80percent stenosed, Mid LAD lesion 50%, Ost LM 40%. She received wt based bivalirudin for anticoagulation during the cath. Fem cath was placed to initiate hypothermia however this was discontinued per report d/t continued hypotension and bleeding from the transport team. Patient was then transferred to [facility] 6/9 for PCCM to manage care. On arrival to the intensive care unit patient was on hemodynamic support of 50 of levophed post 3L crystalloid with SBP in the 70's.   PAST MEDICAL HISTORY :   has a past medical history of History of cholecystectomy; Glucose intolerance (impaired glucose tolerance); Osteoporosis; Rectocele (03/21/2014); Menopause; Vaginal enterocele; Reflux esophagitis; Glaucoma; Abnormal mammogram; Hypertensive cardiovascular disease; Postmenopausal atrophic vaginitis;  Lichen sclerosus; Vaginal ulcer; Constipation; Chickenpox; H/O seasonal allergies; Hypertension; Shingles; Hemorrhoids; GERD (gastroesophageal reflux disease); Stroke; Osteoporosis; Hypertensive cardiovascular  disease; Glaucoma; Dysrhythmia; Hyponatremia; LBBB (left bundle branch block); and Glucose intolerance (impaired glucose tolerance).  has past surgical history that includes Tonsillectomy; Cholecystectomy; Abdominal hysterectomy; bladder tack; and Cardiac catheterization. Prior to Admission medications   Medication Sig Start Date End Date Taking? Authorizing Provider  acetaminophen (TYLENOL) 500 MG tablet Take 500 mg by mouth every 6 (six) hours as needed for moderate pain.    Historical Provider, MD  aspirin EC 81 MG tablet Take 81 mg by mouth daily.    Historical Provider, MD  cholecalciferol (VITAMIN D) 1000 UNITS tablet Take 2,000 Units by mouth daily.    Historical Provider, MD  clobetasol ointment (TEMOVATE) 0.05 % Apply 1 application topically 2 (two) times daily.    Historical Provider, MD  estrogens, conjugated, (PREMARIN) 0.625 MG tablet Take 0.625 mg by mouth daily. Take daily for 21 days then do not take for 7 days.    Historical Provider, MD  hydrALAZINE (APRESOLINE) 50 MG tablet Take 50 mg by mouth 2 (two) times daily.    Historical Provider, MD  latanoprost (XALATAN) 0.005 % ophthalmic solution Place 1 drop into both eyes at bedtime.     Historical Provider, MD  olmesartan (BENICAR) 40 MG tablet Take 40 mg by mouth daily.    Historical Provider, MD  omeprazole (PRILOSEC) 20 MG capsule Take 20 mg by mouth daily.    Historical Provider, MD  sucralfate (CARAFATE) 1 G tablet Take 1 g by mouth 4 (four) times daily.    Historical Provider, MD  Verapamil HCl CR 300 MG CP24 Take 300 mg by mouth at bedtime.    Historical Provider, MD   Allergies  Allergen Reactions  . Codeine Nausea And Vomiting  . Penicillins Rash    FAMILY HISTORY:  has no family status information on file.  SOCIAL HISTORY:  reports that she has never smoked. She does not have any smokeless tobacco history on file. She reports that she does not drink alcohol.  REVIEW OF SYSTEMS:  Unable to obtain.   VITAL  SIGNS: Pulse Rate:  [52-90] 90 (06/09 1031) Resp:  [14-21] 17 (06/09 1031) BP: (81-174)/(58-80) 81/58 mmHg (06/09 1031) SpO2:  [95 %] 95 % (06/09 1031) FiO2 (%):  [50 %-100 %] 50 % (06/09 1025) Weight:  [81.647 kg (180 lb)] 81.647 kg (180 lb) (06/09 0842) HEMODYNAMICS:   VENTILATOR SETTINGS: Vent Mode:  [-] AC FiO2 (%):  [50 %-100 %] 50 % Set Rate:  [24 bmp] 24 bmp Vt Set:  [500 mL] 500 mL PEEP:  [5 cmH20] 5 cmH20 INTAKE / OUTPUT: No intake or output data in the 24 hours ending May 28, 2015 1331  PHYSICAL EXAMINATION: General:  Unresponsive, pale elderly female bleeding from central line and mouth Neuro: Right pupil 5mm sluggish, Left Pupil 4mm sluggish. Positive dolls, positive gag and cough. Flexion of left arm to painful stimulus. No movement of LE.  HEENT:  ETT in place, NG in nose with dark blood in tube and in supraglottic suction Cardiovascular: Regular rate and rhythm without audible rub, gallop, or murmur.  Lungs:  Course crackles throughout right lung, greatly diminished on left Abdomen:  Soft, nondistened Musculoskeletal:  Grossly intact Skin:  Wound to right elbow covered in drsg. Multiple hematomas on UE and LE.  LABS:  CBC  Recent Labs Lab 2015/05/28 0912  WBC 16.3*  HGB 11.9*  HCT 37.9  PLT 117*  Coag's  Recent Labs Lab 2015/05/15 0912  INR 1.69   BMET  Recent Labs Lab 05-15-2015 0912  NA 139  K 3.1*  CL 108  CO2 13*  BUN 19  CREATININE 1.55*  GLUCOSE 442*   Electrolytes  Recent Labs Lab May 15, 2015 0912  CALCIUM 8.0*   Sepsis Markers  Recent Labs Lab 05-15-15 0947  LATICACIDVEN 11.9*   ABG  Recent Labs Lab 05/15/2015 0915 2015/05/15 1145  PHART 7.01* 7.25*  PCO2ART 33 32  PO2ART 335* 102   Liver Enzymes  Recent Labs Lab 05-15-2015 0912  AST 77*  ALT 74*  ALKPHOS 72  BILITOT 0.4  ALBUMIN 3.0*   Cardiac Enzymes  Recent Labs Lab 2015/05/15 0912  TROPONINI 0.22*   Glucose No results for input(s): GLUCAP in the last 168  hours.  Imaging Dg Chest 1 View  05/15/2015   CLINICAL DATA:  Status post intubation following cardiac arrest  EXAM: CHEST  1 VIEW  COMPARISON:  01/29/2014  FINDINGS: Cardiac shadow is mildly enlarged. An endotracheal tube is seen 14 mm above the carina. Nasogastric catheter is noted extending into the stomach. Aortic calcifications are seen. The lungs are well aerated bilaterally without focal infiltrate or sizable effusion. No acute bony abnormality is noted.  IMPRESSION: Endotracheal tube and nasogastric catheter as described. No acute abnormality is noted.   Electronically Signed   By: Alcide Clever M.D.   On: May 15, 2015 09:48   Ct Head Wo Contrast  May 15, 2015   CLINICAL DATA:  Cardiac arrest.  Head injury due to collapse.  EXAM: CT HEAD WITHOUT CONTRAST  TECHNIQUE: Contiguous axial images were obtained from the base of the skull through the vertex without intravenous contrast.  COMPARISON:  CT head 01/29/2014  FINDINGS: Chronic infarct in the left internal capsule and centrum semiovale. Negative for acute ischemic infarction  Negative for hemorrhage or mass.  Ventricle size is normal.  No shift of the midline structures.  No evidence of acute anoxic edema.  IMPRESSION: Atrophy and chronic ischemia.  No acute abnormality.   Electronically Signed   By: Marlan Palau M.D.   On: 2015-05-15 10:35     ASSESSMENT / PLAN:  PULMONARY OETT 8.0 >>> A: Acute hypoxic Respiratory Failure s/p cardiac arrest (P/F ratio 204) in setting of progressive pulmonary edema  P:   -Full vent support: ARDS protocol if we are to continue support  -CXR now and in AM -wean Fio2 as tolerated -VAP protocol   CARDIOVASCULAR CVL 6/9>>>Right femoral  A:  VFib Arrest Cardiogenic Shock  Hypothermia-not medically induced  P:  -Stat Echo -Stat EKG -1 L saline now (3L already received) -Levophed and vasopressin for goal MAP >65 -Lactate now and trend -Cycle cardiac enzymes -Consult cardiology - stress dose  steroids RENAL A:  AKI + AG acidosis (severe lactic acidosis) Hypokalemia  P:   -place foley catheter -Strict I&Os -cont hemodynamic support -renal dose meds  GASTROINTESTINAL A:  No acute process P:   OG placement to LIWS  HEMATOLOGIC A:  Coagulopathy? Multiple hematomas and bleeding from central line, mouth and rt arm  P:  -PT/INR -DIC panel -hold anticoagulation  INFECTIOUS A: No acute process P: Trend WBC curve and fever curve    ENDOCRINE A: No acute problem P:   -Q4 hr glucose -SSI per protocol   NEUROLOGIC A:   Acute Encephalopathy s/p cardiac arrest-->likely severe anoxia   P:   RASS goal: -2 Supportive care   FAMILY  - Updates: Spoke with family  at length to discuss this patients current condition and poor prognosis. Family had decided to continue current medical therapy without escalation of care and DNR status.      TODAY'S SUMMARY: Angelica Mcclain is a 81 yo female who suffered from cardiac arrest with by-standard CPR initiated by husband and taken over by EMS approx 12 minutes later on arrival. ACLS was performed and defibrillated 8 times prior to initial ROSC and king airway placed (appears as though had minimally 24 minutes down time). Subsequent PEA arrest requiring ACLS on arrival to [facility] ED. After negative CT she was taken emergently to Cath lab where she received a stent to a 99% occluded LAD. Hypothermia efforts were terminated by [place] after placement of femoral central line for unclear reasons but may have been d/t complications with patient bleeding. She arrived in the intensive care unit unresponsive and in refractory cardiogenic shock on of levophed and post 3L of crystalloid. Family has been updated and have decided to not escalate care and to transition code status to DNR. Full vent and hemodynamic support will be continued at this time. Prognosis very poor.   2015/05/13, 1:31 PM

## 2015-05-24 NOTE — ED Provider Notes (Addendum)
[facility] Emergency Department Provider Note     Time seen: ----------------------------------------- 9:08 AM on 05/31/2015 -----------------------------------------    I have reviewed the triage vital signs and the nursing notes. Level V caveat: Received review of systems and history unable to be obtained due to patient being unresponsive  HISTORY  Chief Complaint Cardiac Arrest    HPI Angelica Mcclain is a 81 y.o. female who presents ER being brought by EMS after cardiac arrest home. According to report patient and bystanders CPR almost immediately after her husband found her after she fell at home. She was unresponsive. CPR was started she was shocked 8 times by EMS with positive pulse return, she received 4 mg of epinephrine and 450 mg of amiodarone. Coronary reports she recently been complaining of indigestion was supposedly going to have an endoscopy soon. No other history is available   Past Medical History  Diagnosis Date  . History of cholecystectomy   . Glucose intolerance (impaired glucose tolerance)   . Osteoporosis   . Rectocele 03/21/2014    large; minimally symptomatic  . Menopause   . Vaginal enterocele   . Reflux esophagitis   . Glaucoma   . Abnormal mammogram     seen by Dr. Okey Dupre  . Hypertensive cardiovascular disease   . Postmenopausal atrophic vaginitis   . Lichen sclerosus   . Vaginal ulcer   . Constipation   . Chickenpox   . H/O seasonal allergies   . Hypertension   . Shingles   . Hemorrhoids   . GERD (gastroesophageal reflux disease)   . Stroke   . Osteoporosis   . Hypertensive cardiovascular disease   . Glaucoma   . Dysrhythmia   . Hyponatremia   . LBBB (left bundle branch block)   . Glucose intolerance (impaired glucose tolerance)     There are no active problems to display for this patient.   Past Surgical History  Procedure Laterality Date  . Tonsillectomy    . Cholecystectomy    . Abdominal  hysterectomy    . Bladder tack    . Cardiac catheterization      Allergies Codeine and Penicillins  Social History History  Substance Use Topics  . Smoking status: Never Smoker   . Smokeless tobacco: Not on file  . Alcohol Use: No    Review of Systems Review of systems unknown due to being unresponsive  10-point ROS otherwise negative.  ____________________________________________   PHYSICAL EXAM:  VITAL SIGNS: ED Triage Vitals  Enc Vitals Group     BP May 31, 2015 0905 174/80 mmHg     Pulse Rate 05/31/2015 0842 52     Resp May 31, 2015 0842 14     Temp --      Temp src --      SpO2 --      Weight May 31, 2015 0842 180 lb (81.647 kg)     Height 2015/05/31 0842  (1.626 m)     Head Cir --      Peak Flow --      Pain Score --      Pain Loc --      Pain Edu? --      Excl. in GC? --     Constitutional: Patient with GCS of 3, completely unresponsive. Agonal breathing Eyes: Pale conjunctiva ENT   Head: Normocephalic and atraumatic.   Nose: No congestion/rhinnorhea.   Mouth/Throat: Patient with excess secretions and posterior pharynx. Intubated with [facility] airway   Neck: No stridor. Cardiovascular:  Irregularly irregular rhythm, initially in PA on arrival. No distal pulses on arrival. Respiratory: Agonal breathing, lung sounds grossly clear. Gastrointestinal: Distended abdomen, hypoactive bowel sounds Musculoskeletal: No obvious edema is noted Neurologic:  GCS is 3T. Intubated with Brooke Dare airway Skin:  Skin is warm, dry and intact. Pallor is noted  ____________________________________________  EKG: Interpreted by me. Sinus rhythm, left axis deviation, left bundle branch block. First-degree AV block, baseline artifact. Normal QRS with, normal QT interval.  ____________________________________________  ED COURSE:  Pertinent labs & imaging results that were available during my care of the patient were reviewed by me and considered in my medical decision making (see  chart for details). Initially patient was in [place], CPR started. Patient given epinephrine IV 2 atropine IV 1.   King airway was changed over to 80 ET tube using the [place]. Secured at 25 cm at the lip. Good breath sounds bilaterally.  Patient was placed on amiodarone drip due to V. fib arrest earlier.  Central line placed as document below  Discussed with Dr. Meredeth Ide pulmonary, hypothermic protocol be initiated. Patient also be given sodium bicarbonate due to acidemia  CENTRAL LINE Performed by: Daryel November E Consent: The procedure was performed in an emergent situation. Required items: required blood products, implants, devices, and special equipment available Patient identity confirmed: arm band and provided demographic data Time ou, Marina Goodell arrestt: Immediately prior to procedure a "time out" was called to verify the correct patient, procedure, equipment, support staff and site/side marked as required. Indications: vascular access Anesthesia: local infiltration Patient sedated: no Preparation: skin prepped with 2% chlorhexidine Skin prep agent dried: skin prep agent completely dried prior to procedure Sterile barriers: all five maximum sterile barriers used - cap, mask, sterile gown, sterile gloves, and large sterile sheet Hand hygiene: hand hygiene performed prior to central venous catheter insertion  Location details: right femoral vein  Catheter type: triple lumen Catheter size: 8 Fr Pre-procedure: landmarks identified Ultrasound guidance: none Successful placement: yes Post-procedure: line sutured and dressing applied Assessment: blood return through all parts, free fluid flow, placement verified by x-ray and no pneumothorax on x-ray Patient tolerance: Patient tolerated the procedure well with no immediate complications. ____________________________________________    LABS (pertinent positives/negatives)  Labs Reviewed  BLOOD GAS, ARTERIAL - Abnormal; Notable for  the following:    pH, Arterial 7.01 (*)    pO2, Arterial 335 (*)    Bicarbonate 8.3 (*)    Acid-base deficit 21.9 (*)    Allens test (pass/fail) POSITIVE (*)    All other components within normal limits  CBC WITH DIFFERENTIAL/PLATELET  COMPREHENSIVE METABOLIC PANEL  LACTIC ACID, PLASMA  LACTIC ACID, PLASMA  TROPONIN I  PROTIME-INR  URINALYSIS COMPLETEWITH MICROSCOPIC ([facility] ONLY)  CBG MONITORING, ED    RADIOLOGY  Chest x-rayIMPRESSION: Endotracheal tube and nasogastric catheter as described. No acute abnormality is noted.  CRITICAL CARE Performed by: Emily Filbert   Total critical care time: 30 minutes  Critical care time was exclusive of separately billable procedures and treating other patients.  Critical care was necessary to treat or prevent imminent or life-threatening deterioration.  Critical care was time spent personally by me on the following activities: development of treatment plan with patient and/or surrogate as well as nursing, discussions with consultants, evaluation of patient's response to treatment, examination of patient, obtaining history from patient or surrogate, ordering and performing treatments and interventions, ordering and review of laboratory studies, ordering and review of radiographic studies, pulse oximetry and re-evaluation of patient's condition.  ____________________________________________  FINAL ASSESSMENT AND PLAN  Cardiac arrest, ventricular fibrillation, central venous line placement, rapid sequence intubation, acidemia  Plan: Patient being admitted to the ICU under hypothermia protocol. We'll need likely catheter. Patient currently critical but vitals are stable this time. She has not had any further arrhythmia, is continuing on amiodarone drip. She will need CT scanner head to rule out bleed secondary to fall.  Patient did drop her pressure to 70s, will start on Levaquin set drip.    Emily Filbert,  MD   Emily Filbert, MD 05/21/2015 0945  Emily Filbert, MD 2015/05/21 1005  Emily Filbert, MD 05/21/15 1006  Emily Filbert, MD 05/03/15 [phone]

## 2015-05-24 DEATH — deceased

## 2016-09-25 IMAGING — CR DG CHEST 1V
1 series · 1 of 1 positions shown · non-contrast
Comparison: 01/29/2014

CLINICAL DATA: Status post intubation following cardiac arrest

EXAM:
CHEST  1 VIEW

[ap]
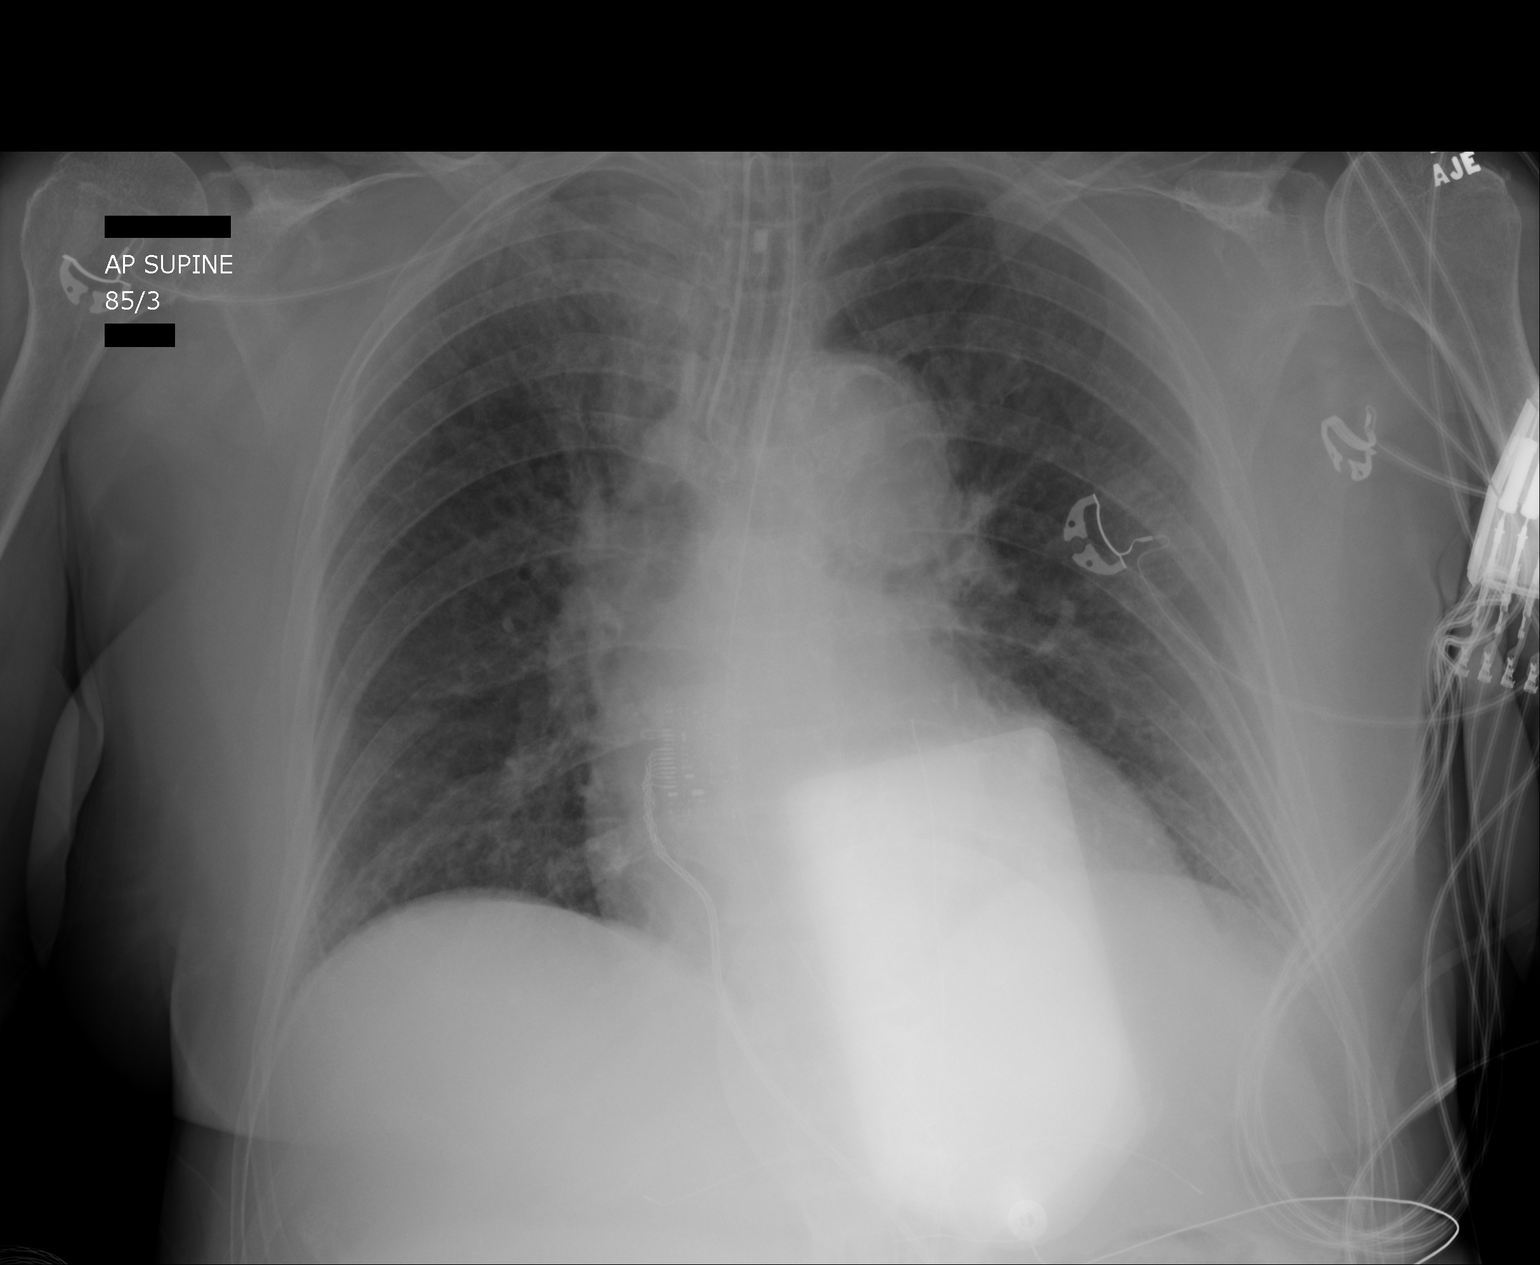

[1 of 1 positions shown; findings below may reference images not displayed]

FINDINGS: Cardiac shadow is mildly enlarged. An endotracheal tube is seen 14
mm above the carina. Nasogastric catheter is noted extending into
the stomach. Aortic calcifications are seen. The lungs are well
aerated bilaterally without focal infiltrate or sizable effusion. No
acute bony abnormality is noted.
IMPRESSION: Endotracheal tube and nasogastric catheter as described. No acute
abnormality is noted.
